# Patient Record
Sex: Male | Born: 1955 | Race: Black or African American | Hispanic: No | Marital: Married
Health system: Southern US, Community
[De-identification: ages and names within clinical notes are randomized; demographics above are authoritative.]

---

## 2014-03-27 ENCOUNTER — Ambulatory Visit
Admission: RE | Admit: 2014-03-27 | Discharge: 2014-03-27 | Disposition: A | Discharge status: Home / Self Care | Payer: BC Managed Care – PPO | Source: Ambulatory Visit | Attending: Internal Medicine | Admitting: Internal Medicine

## 2014-03-27 ENCOUNTER — Other Ambulatory Visit: Payer: Self-pay | Admitting: Internal Medicine

## 2014-03-27 DIAGNOSIS — M25542 Pain in joints of left hand: Secondary | ICD-10-CM

## 2014-03-27 DIAGNOSIS — N289 Disorder of kidney and ureter, unspecified: Secondary | ICD-10-CM

## 2014-03-27 DIAGNOSIS — M25549 Pain in joints of unspecified hand: Secondary | ICD-10-CM

## 2014-04-03 ENCOUNTER — Ambulatory Visit
Admission: RE | Admit: 2014-04-03 | Discharge: 2014-04-03 | Disposition: A | Discharge status: Home / Self Care | Payer: BC Managed Care – PPO | Source: Ambulatory Visit | Attending: Internal Medicine | Admitting: Internal Medicine

## 2014-04-03 DIAGNOSIS — N289 Disorder of kidney and ureter, unspecified: Secondary | ICD-10-CM

## 2014-04-06 ENCOUNTER — Other Ambulatory Visit: Payer: Self-pay

## 2015-11-21 IMAGING — CR DG HAND COMPLETE 3+V*L*
3 series · 3 of 3 positions shown · non-contrast
Comparison: None.

CLINICAL DATA: Left hand pain

EXAM:
LEFT HAND - COMPLETE 3+ VIEW

[view not recorded (1 of 3)]
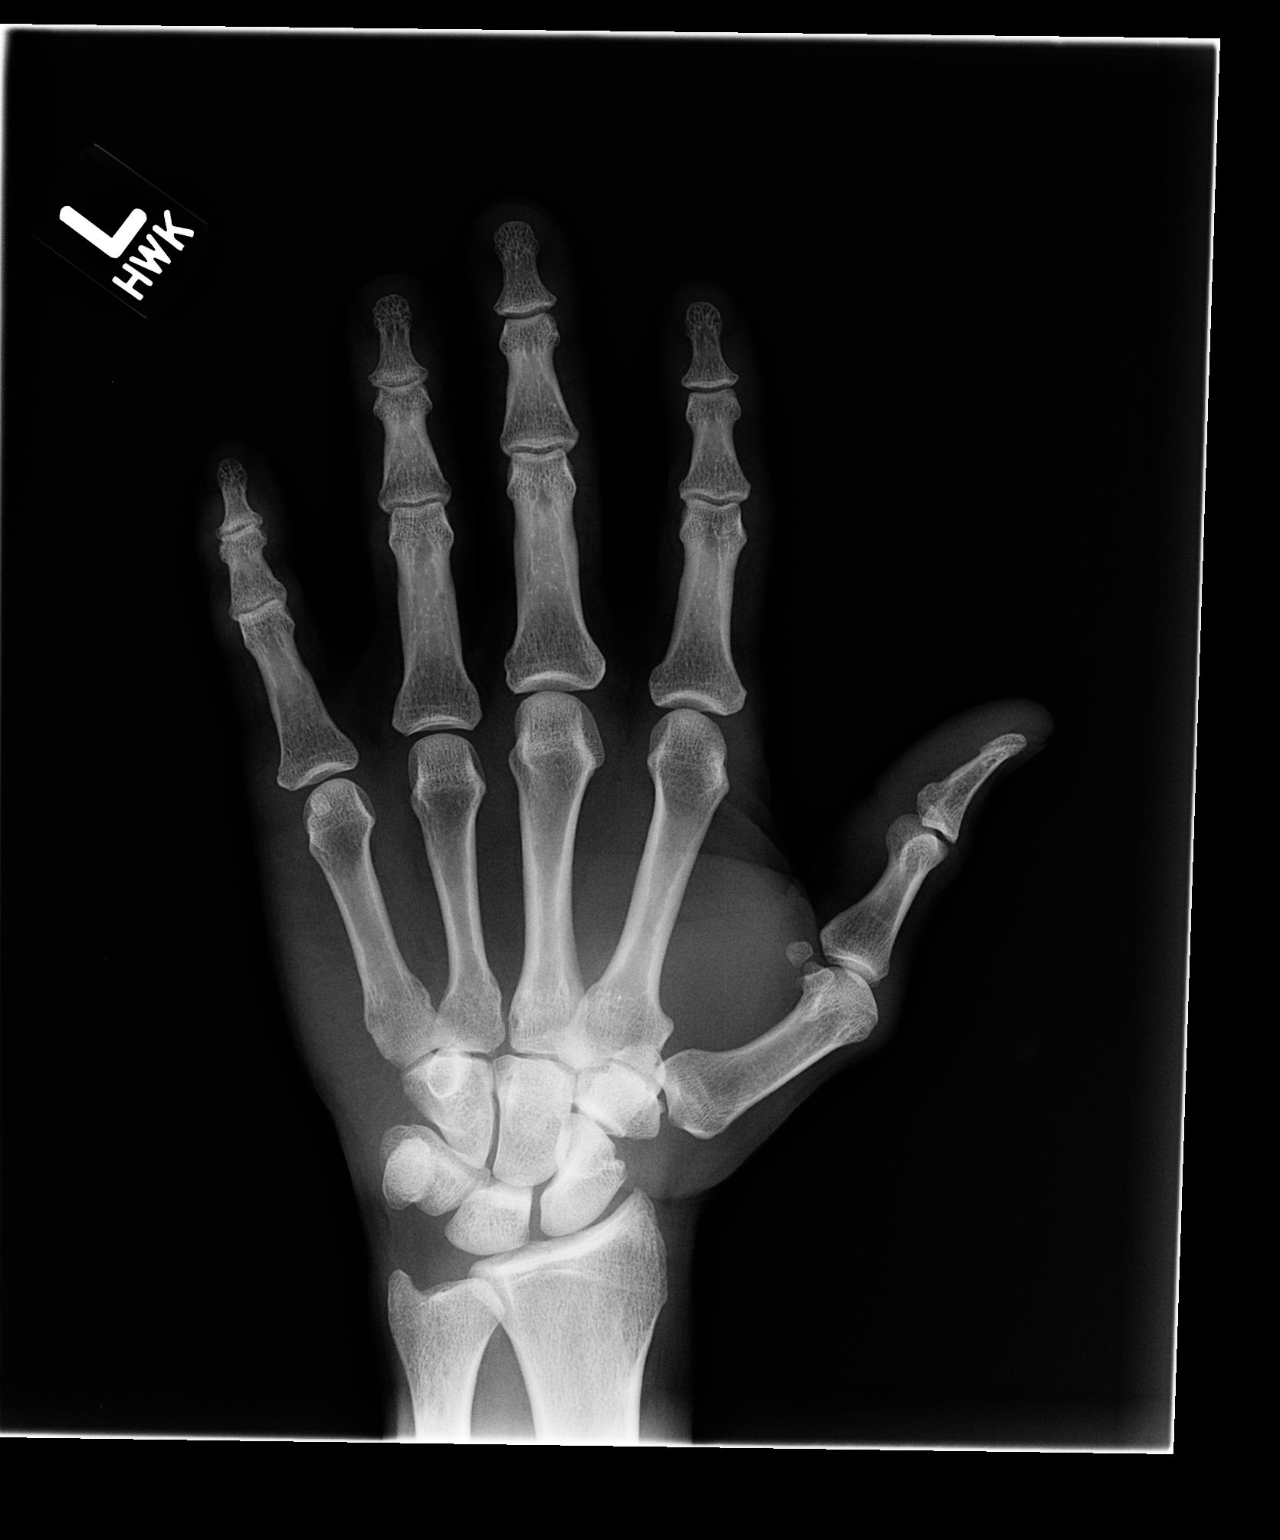

[view not recorded (2 of 3)]
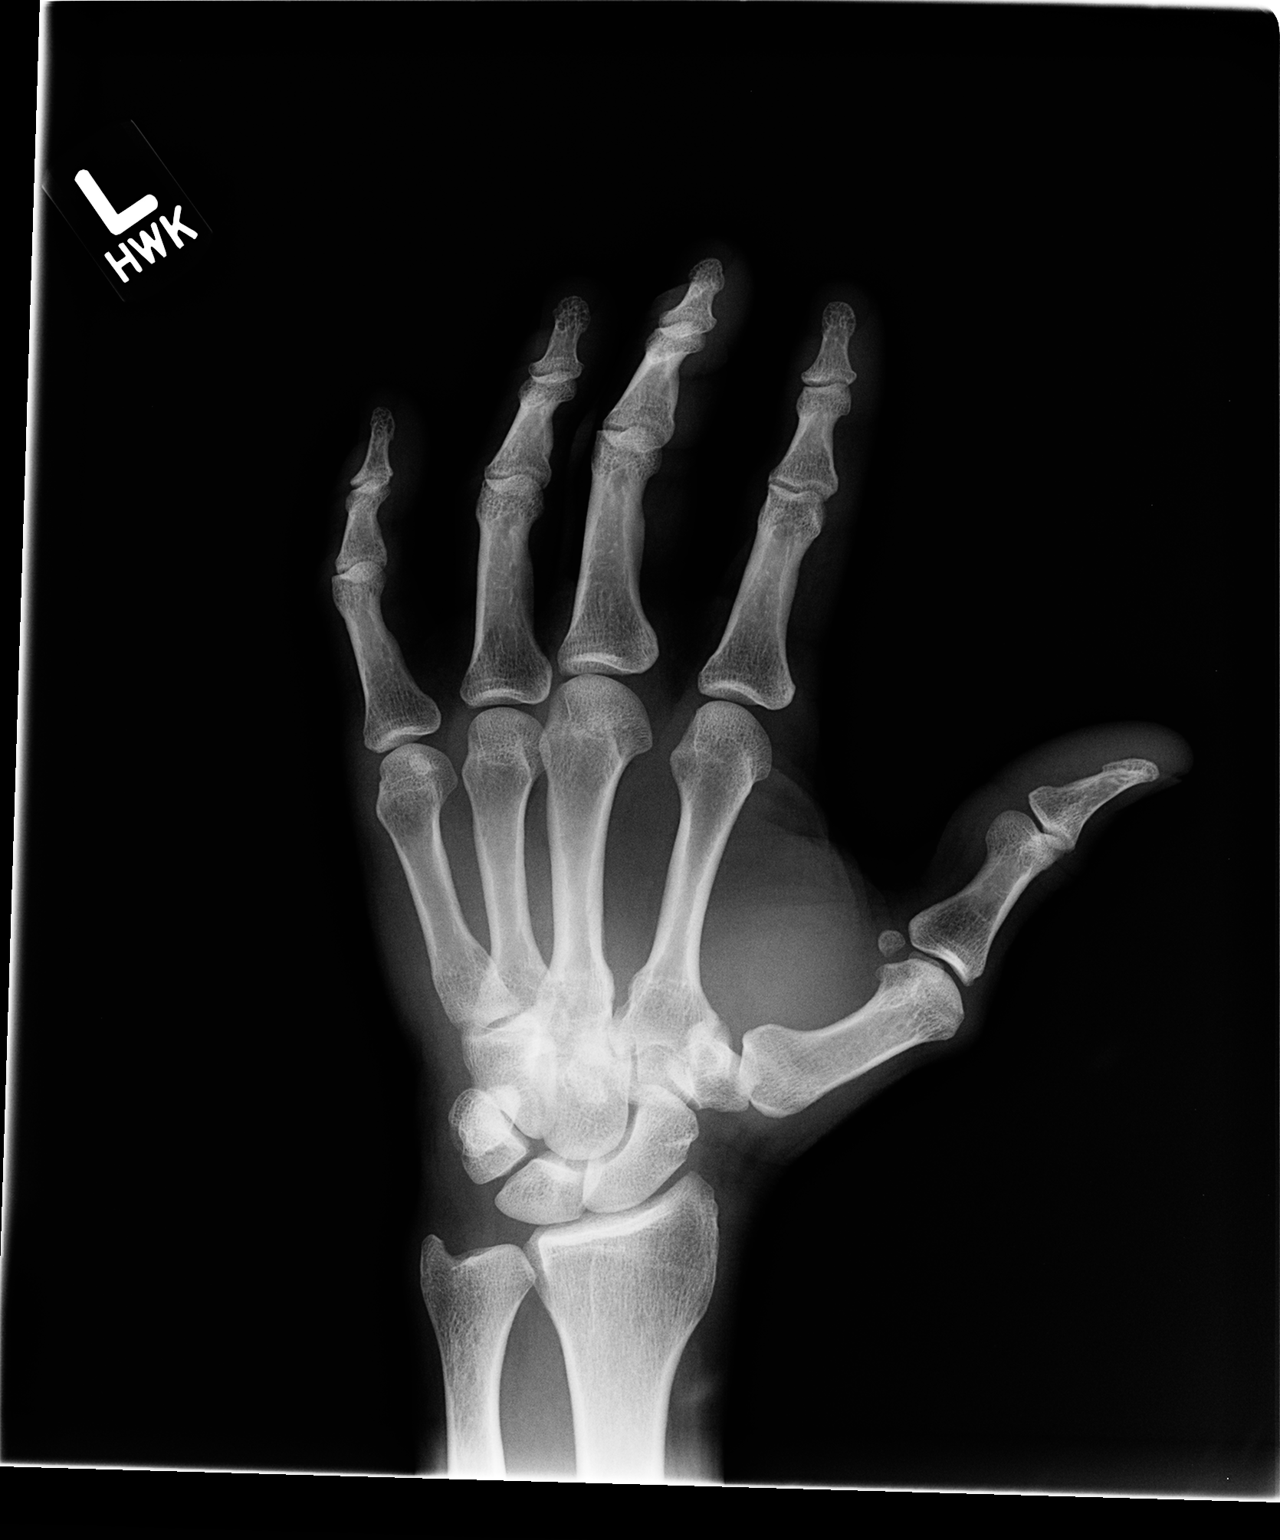

[view not recorded (3 of 3)]
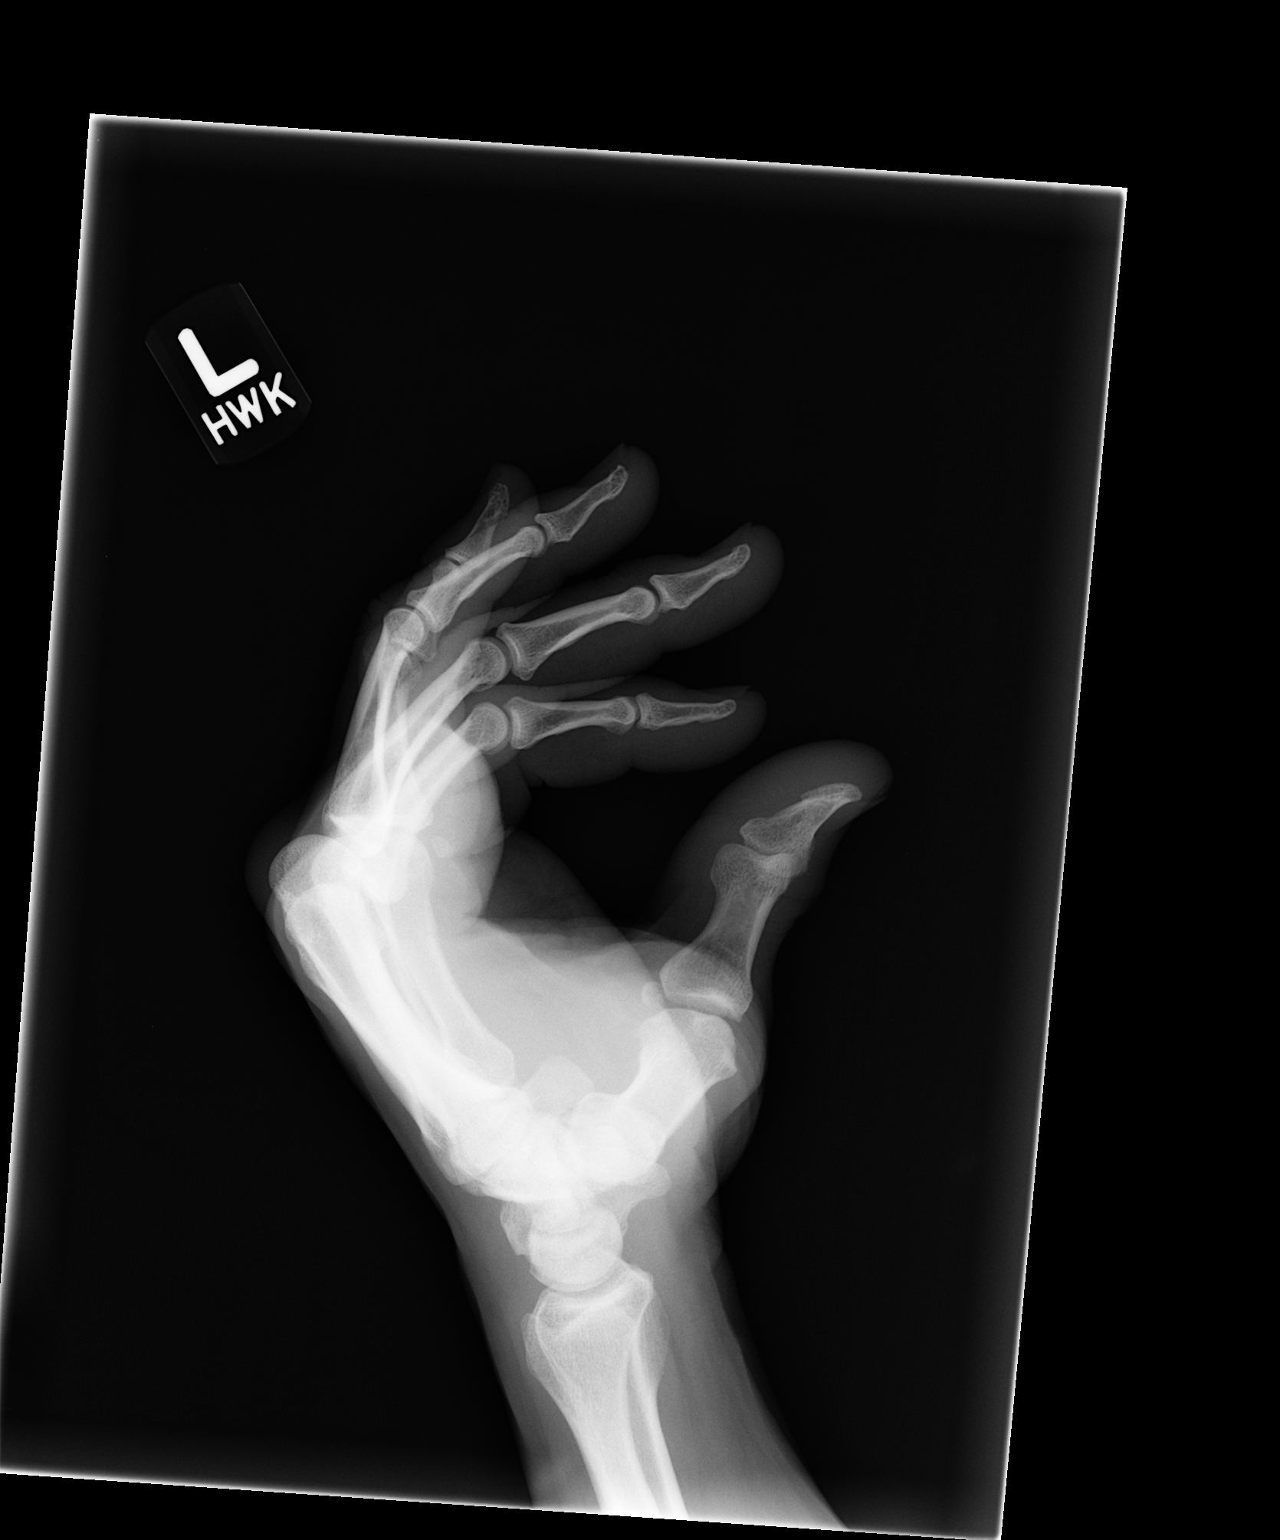

[3 of 3 positions shown; findings below may reference images not displayed]

FINDINGS: Three views of the left hand submitted No acute fracture or
subluxation. Joint spaces are preserved. No periosteal reaction or
bony erosion.
IMPRESSION: Negative.

## 2016-09-04 DIAGNOSIS — S81811A Laceration without foreign body, right lower leg, initial encounter: Secondary | ICD-10-CM | POA: Diagnosis not present

## 2016-09-04 DIAGNOSIS — W540XXA Bitten by dog, initial encounter: Secondary | ICD-10-CM | POA: Diagnosis not present

## 2016-09-04 DIAGNOSIS — Z23 Encounter for immunization: Secondary | ICD-10-CM | POA: Diagnosis not present

## 2017-02-15 DIAGNOSIS — I10 Essential (primary) hypertension: Secondary | ICD-10-CM | POA: Diagnosis not present

## 2017-02-15 DIAGNOSIS — Z125 Encounter for screening for malignant neoplasm of prostate: Secondary | ICD-10-CM | POA: Diagnosis not present

## 2017-02-15 DIAGNOSIS — E559 Vitamin D deficiency, unspecified: Secondary | ICD-10-CM | POA: Diagnosis not present

## 2017-02-15 DIAGNOSIS — Z79899 Other long term (current) drug therapy: Secondary | ICD-10-CM | POA: Diagnosis not present

## 2017-02-15 DIAGNOSIS — Z0000 Encounter for general adult medical examination without abnormal findings: Secondary | ICD-10-CM | POA: Diagnosis not present

## 2017-11-15 DIAGNOSIS — M438X9 Other specified deforming dorsopathies, site unspecified: Secondary | ICD-10-CM | POA: Diagnosis not present

## 2017-11-15 DIAGNOSIS — M5136 Other intervertebral disc degeneration, lumbar region: Secondary | ICD-10-CM | POA: Diagnosis not present

## 2018-02-19 DIAGNOSIS — I10 Essential (primary) hypertension: Secondary | ICD-10-CM | POA: Diagnosis not present

## 2018-02-19 DIAGNOSIS — E559 Vitamin D deficiency, unspecified: Secondary | ICD-10-CM | POA: Diagnosis not present

## 2018-02-19 DIAGNOSIS — Z79899 Other long term (current) drug therapy: Secondary | ICD-10-CM | POA: Diagnosis not present

## 2018-02-19 DIAGNOSIS — Z0000 Encounter for general adult medical examination without abnormal findings: Secondary | ICD-10-CM | POA: Diagnosis not present

## 2018-02-19 DIAGNOSIS — Z125 Encounter for screening for malignant neoplasm of prostate: Secondary | ICD-10-CM | POA: Diagnosis not present

## 2019-07-19 DIAGNOSIS — R609 Edema, unspecified: Secondary | ICD-10-CM | POA: Diagnosis not present

## 2019-07-19 DIAGNOSIS — T63451A Toxic effect of venom of hornets, accidental (unintentional), initial encounter: Secondary | ICD-10-CM | POA: Diagnosis not present

## 2019-07-21 DIAGNOSIS — Z0000 Encounter for general adult medical examination without abnormal findings: Secondary | ICD-10-CM | POA: Diagnosis not present

## 2019-08-06 DIAGNOSIS — Z125 Encounter for screening for malignant neoplasm of prostate: Secondary | ICD-10-CM | POA: Diagnosis not present

## 2019-08-06 DIAGNOSIS — E559 Vitamin D deficiency, unspecified: Secondary | ICD-10-CM | POA: Diagnosis not present

## 2019-08-06 DIAGNOSIS — I10 Essential (primary) hypertension: Secondary | ICD-10-CM | POA: Diagnosis not present

## 2019-09-30 DIAGNOSIS — Z1159 Encounter for screening for other viral diseases: Secondary | ICD-10-CM | POA: Diagnosis not present

## 2019-10-03 DIAGNOSIS — Z1211 Encounter for screening for malignant neoplasm of colon: Secondary | ICD-10-CM | POA: Diagnosis not present

## 2019-10-03 DIAGNOSIS — D123 Benign neoplasm of transverse colon: Secondary | ICD-10-CM | POA: Diagnosis not present

## 2020-07-12 DIAGNOSIS — Z20822 Contact with and (suspected) exposure to covid-19: Secondary | ICD-10-CM | POA: Diagnosis not present

## 2020-07-22 DIAGNOSIS — Z20822 Contact with and (suspected) exposure to covid-19: Secondary | ICD-10-CM | POA: Diagnosis not present

## 2020-07-27 DIAGNOSIS — Z0000 Encounter for general adult medical examination without abnormal findings: Secondary | ICD-10-CM | POA: Diagnosis not present

## 2020-07-27 DIAGNOSIS — Z125 Encounter for screening for malignant neoplasm of prostate: Secondary | ICD-10-CM | POA: Diagnosis not present

## 2020-07-27 DIAGNOSIS — I10 Essential (primary) hypertension: Secondary | ICD-10-CM | POA: Diagnosis not present

## 2020-07-27 DIAGNOSIS — E559 Vitamin D deficiency, unspecified: Secondary | ICD-10-CM | POA: Diagnosis not present

## 2020-07-27 DIAGNOSIS — D123 Benign neoplasm of transverse colon: Secondary | ICD-10-CM | POA: Diagnosis not present

## 2020-07-27 DIAGNOSIS — Z23 Encounter for immunization: Secondary | ICD-10-CM | POA: Diagnosis not present

## 2020-07-27 DIAGNOSIS — Z79899 Other long term (current) drug therapy: Secondary | ICD-10-CM | POA: Diagnosis not present

## 2020-07-30 ENCOUNTER — Other Ambulatory Visit: Payer: Self-pay

## 2020-07-30 DIAGNOSIS — Z20822 Contact with and (suspected) exposure to covid-19: Secondary | ICD-10-CM

## 2020-08-01 LAB — NOVEL CORONAVIRUS, NAA: SARS-CoV-2, NAA: NOT DETECTED

## 2020-08-01 LAB — SARS-COV-2, NAA 2 DAY TAT

## 2020-08-18 DIAGNOSIS — Z20822 Contact with and (suspected) exposure to covid-19: Secondary | ICD-10-CM | POA: Diagnosis not present

## 2021-08-31 DIAGNOSIS — Z0000 Encounter for general adult medical examination without abnormal findings: Secondary | ICD-10-CM | POA: Diagnosis not present

## 2021-08-31 DIAGNOSIS — Z125 Encounter for screening for malignant neoplasm of prostate: Secondary | ICD-10-CM | POA: Diagnosis not present

## 2021-08-31 DIAGNOSIS — Z79899 Other long term (current) drug therapy: Secondary | ICD-10-CM | POA: Diagnosis not present

## 2021-08-31 DIAGNOSIS — E559 Vitamin D deficiency, unspecified: Secondary | ICD-10-CM | POA: Diagnosis not present

## 2021-08-31 DIAGNOSIS — D123 Benign neoplasm of transverse colon: Secondary | ICD-10-CM | POA: Diagnosis not present

## 2021-08-31 DIAGNOSIS — Z23 Encounter for immunization: Secondary | ICD-10-CM | POA: Diagnosis not present

## 2021-08-31 DIAGNOSIS — I10 Essential (primary) hypertension: Secondary | ICD-10-CM | POA: Diagnosis not present

## 2021-08-31 DIAGNOSIS — Z13220 Encounter for screening for lipoid disorders: Secondary | ICD-10-CM | POA: Diagnosis not present

## 2021-08-31 DIAGNOSIS — D369 Benign neoplasm, unspecified site: Secondary | ICD-10-CM | POA: Diagnosis not present

## 2022-01-10 DIAGNOSIS — R059 Cough, unspecified: Secondary | ICD-10-CM | POA: Diagnosis not present

## 2022-01-10 DIAGNOSIS — I10 Essential (primary) hypertension: Secondary | ICD-10-CM | POA: Diagnosis not present

## 2022-01-10 DIAGNOSIS — H1130 Conjunctival hemorrhage, unspecified eye: Secondary | ICD-10-CM | POA: Diagnosis not present

## 2022-11-13 DIAGNOSIS — I10 Essential (primary) hypertension: Secondary | ICD-10-CM | POA: Diagnosis not present

## 2022-11-13 DIAGNOSIS — Z79899 Other long term (current) drug therapy: Secondary | ICD-10-CM | POA: Diagnosis not present

## 2022-11-13 DIAGNOSIS — Z125 Encounter for screening for malignant neoplasm of prostate: Secondary | ICD-10-CM | POA: Diagnosis not present

## 2022-11-13 DIAGNOSIS — Z23 Encounter for immunization: Secondary | ICD-10-CM | POA: Diagnosis not present

## 2022-11-13 DIAGNOSIS — E559 Vitamin D deficiency, unspecified: Secondary | ICD-10-CM | POA: Diagnosis not present

## 2022-11-29 DIAGNOSIS — I10 Essential (primary) hypertension: Secondary | ICD-10-CM | POA: Diagnosis not present

## 2023-04-11 DIAGNOSIS — Z86010 Personal history of colonic polyps: Secondary | ICD-10-CM | POA: Diagnosis not present

## 2023-04-11 DIAGNOSIS — K644 Residual hemorrhoidal skin tags: Secondary | ICD-10-CM | POA: Diagnosis not present

## 2023-04-11 DIAGNOSIS — D124 Benign neoplasm of descending colon: Secondary | ICD-10-CM | POA: Diagnosis not present

## 2023-04-11 DIAGNOSIS — D123 Benign neoplasm of transverse colon: Secondary | ICD-10-CM | POA: Diagnosis not present

## 2023-04-11 DIAGNOSIS — K648 Other hemorrhoids: Secondary | ICD-10-CM | POA: Diagnosis not present

## 2023-11-19 DIAGNOSIS — Z79899 Other long term (current) drug therapy: Secondary | ICD-10-CM | POA: Diagnosis not present

## 2023-11-19 DIAGNOSIS — Z125 Encounter for screening for malignant neoplasm of prostate: Secondary | ICD-10-CM | POA: Diagnosis not present

## 2023-11-19 DIAGNOSIS — E559 Vitamin D deficiency, unspecified: Secondary | ICD-10-CM | POA: Diagnosis not present

## 2023-11-19 DIAGNOSIS — N1831 Chronic kidney disease, stage 3a: Secondary | ICD-10-CM | POA: Diagnosis not present

## 2023-11-19 DIAGNOSIS — I10 Essential (primary) hypertension: Secondary | ICD-10-CM | POA: Diagnosis not present

## 2023-11-19 DIAGNOSIS — Z0000 Encounter for general adult medical examination without abnormal findings: Secondary | ICD-10-CM | POA: Diagnosis not present

## 2024-11-27 DIAGNOSIS — I10 Essential (primary) hypertension: Secondary | ICD-10-CM | POA: Diagnosis not present

## 2024-11-27 DIAGNOSIS — Z13220 Encounter for screening for lipoid disorders: Secondary | ICD-10-CM | POA: Diagnosis not present

## 2024-11-27 DIAGNOSIS — I499 Cardiac arrhythmia, unspecified: Secondary | ICD-10-CM | POA: Diagnosis not present

## 2024-11-27 DIAGNOSIS — Z0000 Encounter for general adult medical examination without abnormal findings: Secondary | ICD-10-CM | POA: Diagnosis not present

## 2024-11-27 DIAGNOSIS — E559 Vitamin D deficiency, unspecified: Secondary | ICD-10-CM | POA: Diagnosis not present

## 2024-11-27 DIAGNOSIS — Z23 Encounter for immunization: Secondary | ICD-10-CM | POA: Diagnosis not present

## 2024-11-27 DIAGNOSIS — Z125 Encounter for screening for malignant neoplasm of prostate: Secondary | ICD-10-CM | POA: Diagnosis not present

## 2024-11-27 DIAGNOSIS — Z79899 Other long term (current) drug therapy: Secondary | ICD-10-CM | POA: Diagnosis not present
